# Patient Record
Sex: Female | Born: 1980 | Hispanic: No | Marital: Married | Smoking: Current every day smoker
Health system: Southern US, Community
[De-identification: ages and names within clinical notes are randomized; demographics above are authoritative.]

## PROBLEM LIST (undated history)

## (undated) DIAGNOSIS — K3184 Gastroparesis: Secondary | ICD-10-CM

## (undated) DIAGNOSIS — F32A Depression, unspecified: Secondary | ICD-10-CM

## (undated) DIAGNOSIS — G43909 Migraine, unspecified, not intractable, without status migrainosus: Secondary | ICD-10-CM

## (undated) DIAGNOSIS — F419 Anxiety disorder, unspecified: Secondary | ICD-10-CM

## (undated) DIAGNOSIS — M069 Rheumatoid arthritis, unspecified: Secondary | ICD-10-CM

## (undated) DIAGNOSIS — K219 Gastro-esophageal reflux disease without esophagitis: Secondary | ICD-10-CM

## (undated) DIAGNOSIS — F329 Major depressive disorder, single episode, unspecified: Secondary | ICD-10-CM

## (undated) HISTORY — PX: CHOLECYSTECTOMY: SHX55

## (undated) HISTORY — PX: OTHER SURGICAL HISTORY: SHX169

## (undated) HISTORY — PX: ABDOMINAL HYSTERECTOMY: SHX81

## (undated) HISTORY — PX: TONSILLECTOMY: SUR1361

---
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
## 2013-12-30 DEATH — deceased

## 2019-08-11 ENCOUNTER — Emergency Department (HOSPITAL_BASED_OUTPATIENT_CLINIC_OR_DEPARTMENT_OTHER): Payer: BC Managed Care – PPO

## 2019-08-11 ENCOUNTER — Emergency Department (HOSPITAL_BASED_OUTPATIENT_CLINIC_OR_DEPARTMENT_OTHER)
Admission: EM | Admit: 2019-08-11 | Discharge: 2019-08-11 | Disposition: A | Discharge status: Home / Self Care | Payer: BC Managed Care – PPO | Attending: Emergency Medicine | Admitting: Emergency Medicine

## 2019-08-11 ENCOUNTER — Other Ambulatory Visit: Payer: Self-pay

## 2019-08-11 ENCOUNTER — Encounter (HOSPITAL_BASED_OUTPATIENT_CLINIC_OR_DEPARTMENT_OTHER): Payer: Self-pay | Admitting: Emergency Medicine

## 2019-08-11 DIAGNOSIS — R509 Fever, unspecified: Secondary | ICD-10-CM | POA: Diagnosis not present

## 2019-08-11 DIAGNOSIS — M7918 Myalgia, other site: Secondary | ICD-10-CM | POA: Diagnosis present

## 2019-08-11 DIAGNOSIS — Z20822 Contact with and (suspected) exposure to covid-19: Secondary | ICD-10-CM

## 2019-08-11 DIAGNOSIS — F17200 Nicotine dependence, unspecified, uncomplicated: Secondary | ICD-10-CM | POA: Insufficient documentation

## 2019-08-11 DIAGNOSIS — Z79899 Other long term (current) drug therapy: Secondary | ICD-10-CM | POA: Diagnosis not present

## 2019-08-11 DIAGNOSIS — R197 Diarrhea, unspecified: Secondary | ICD-10-CM | POA: Diagnosis not present

## 2019-08-11 DIAGNOSIS — R1110 Vomiting, unspecified: Secondary | ICD-10-CM | POA: Insufficient documentation

## 2019-08-11 DIAGNOSIS — J45909 Unspecified asthma, uncomplicated: Secondary | ICD-10-CM | POA: Diagnosis not present

## 2019-08-11 HISTORY — DX: Gastro-esophageal reflux disease without esophagitis: K21.9

## 2019-08-11 HISTORY — DX: Migraine, unspecified, not intractable, without status migrainosus: G43.909

## 2019-08-11 HISTORY — DX: Rheumatoid arthritis, unspecified: M06.9

## 2019-08-11 HISTORY — DX: Anxiety disorder, unspecified: F41.9

## 2019-08-11 HISTORY — DX: Depression, unspecified: F32.A

## 2019-08-11 LAB — COMPREHENSIVE METABOLIC PANEL
ALT: 18 U/L (ref 0–44)
AST: 17 U/L (ref 15–41)
Albumin: 3.9 g/dL (ref 3.5–5.0)
Alkaline Phosphatase: 70 U/L (ref 38–126)
Anion gap: 7 (ref 5–15)
BUN: 10 mg/dL (ref 6–20)
CO2: 19 mmol/L — ABNORMAL LOW (ref 22–32)
Calcium: 8.9 mg/dL (ref 8.9–10.3)
Chloride: 111 mmol/L (ref 98–111)
Creatinine, Ser: 0.83 mg/dL (ref 0.44–1.00)
GFR calc Af Amer: >60 mL/min (ref >60)
GFR calc non Af Amer: >60 mL/min (ref >60)
Glucose, Bld: 91 mg/dL (ref 70–99)
Potassium: 3.7 mmol/L (ref 3.5–5.1)
Sodium: 137 mmol/L (ref 135–145)
Total Bilirubin: 0.8 mg/dL (ref 0.3–1.2)
Total Protein: 6.5 g/dL (ref 6.5–8.1)

## 2019-08-11 LAB — CBC WITH DIFFERENTIAL/PLATELET
Abs Immature Granulocytes: 0.05 10*3/uL (ref 0.00–0.07)
Basophils Absolute: 0 10*3/uL (ref 0.0–0.1)
Basophils Relative: 0 %
Eosinophils Absolute: 0.6 10*3/uL — ABNORMAL HIGH (ref 0.0–0.5)
Eosinophils Relative: 6 %
HCT: 46 % (ref 36.0–46.0)
Hemoglobin: 15.1 g/dL — ABNORMAL HIGH (ref 12.0–15.0)
Immature Granulocytes: 1 %
Lymphocytes Relative: 26 %
Lymphs Abs: 2.8 10*3/uL (ref 0.7–4.0)
MCH: 29.9 pg (ref 26.0–34.0)
MCHC: 32.8 g/dL (ref 30.0–36.0)
MCV: 91.1 fL (ref 80.0–100.0)
Monocytes Absolute: 1 10*3/uL (ref 0.1–1.0)
Monocytes Relative: 9 %
Neutro Abs: 6.1 10*3/uL (ref 1.7–7.7)
Neutrophils Relative %: 58 %
Platelets: 249 10*3/uL (ref 150–400)
RBC: 5.05 MIL/uL (ref 3.87–5.11)
RDW: 13.2 % (ref 11.5–15.5)
WBC: 10.5 10*3/uL (ref 4.0–10.5)
nRBC: 0 % (ref 0.0–0.2)

## 2019-08-11 LAB — SARS CORONAVIRUS 2 (TAT 6-24 HRS): SARS Coronavirus 2: NEGATIVE

## 2019-08-11 MED ORDER — SODIUM CHLORIDE 0.9 % IV BOLUS
1000.0000 mL | Freq: Once | INTRAVENOUS | Status: AC
  Administered 2019-08-11: 1000 mL via INTRAVENOUS

## 2019-08-11 MED ORDER — ONDANSETRON 4 MG PO TBDP: 4.0000 mg | ORAL_TABLET | Freq: Three times a day (TID) | ORAL | 0 refills | Status: AC | PRN

## 2019-08-11 NOTE — ED Notes (Signed)
Pt to restroom, delay to triage

## 2019-08-11 NOTE — ED Notes (Signed)
ED Provider at bedside. 

## 2019-08-11 NOTE — Discharge Instructions (Addendum)
We will contact you with the results of your Covid test when it is available. Continue Tylenol as needed increase your oral hydration. Return to the ED with start to develop chest pain, shortness of breath, inability to tolerate anything by mouth, lightheadedness or loss of consciousness.

## 2019-08-11 NOTE — ED Provider Notes (Signed)
[facility] Provider Note   CSN: [id number] Arrival date & time: 08/11/19  1015     History Chief Complaint  Patient presents with   Covid symptoms    [facility] is a 39 y.o. female with a past medical history of GERD, migraines, rheumatoid arthritis presenting to the ED with a chief complaint of body aches, vomiting and diarrhea.  Starting on the evening of 08/09/2019, patient had gradual onset of cramping abdominal pain, chills, nonbloody, nonbilious emesis and diarrhea.  States that her symptoms have gradually progressed since yesterday.  Fortunately she has not had any diarrhea or vomiting since the last night at 8 PM.  She reports generalized body aches, fatigue and nightly fevers, decreased appetite, persistent dry cough.  She has been taking Tylenol with some improvement in her symptoms.  She had a negative rapid Covid test yesterday and a negative rapid flu test today both at [facility].  She was sent to the ER for further evaluation concern for Covid.  She denies any known COVID-19 exposures, shortness of breath, chest pain, urinary symptoms, possibility of pregnancy (has a hysterectomy), chronic lung disease, supplemental oxygen use at baseline, neck stiffness, injuries or falls.  HPI     Past Medical History:  Diagnosis Date   Anxiety    Depression    GERD (gastroesophageal reflux disease)    Migraines    Rheumatoid arthritis ([place])     There are no problems to display for this patient.    OB History   No obstetric history on file.     No family history on file.  Social History   Tobacco Use   Smoking status: Current Every Day Smoker  Substance Use Topics   Alcohol use: Never   Drug use: Not on file    Home Medications Prior to Admission medications   Medication Sig Start Date End Date Taking? Authorizing Provider  ARIPiprazole (ABILIFY) 10 MG tablet  07/28/19  Yes [provider]  gabapentin  (NEURONTIN) 300 MG capsule  07/15/19  Yes [provider]  montelukast (SINGULAIR) 10 MG tablet  09/29/18  Yes [provider]  pantoprazole (PROTONIX) 40 MG tablet take 1 tablet by mouth once daily 05/23/16  Yes [provider]  methocarbamol (ROBAXIN) 500 MG tablet Take 500 mg by mouth 3 (three) times daily. 07/15/19   [provider]  ondansetron (ZOFRAN ODT) 4 MG disintegrating tablet Take 1 tablet (4 mg total) by mouth every 8 (eight) hours as needed for nausea or vomiting. 08/11/19   Anupama Piehl, PA-C    Allergies    Azithromycin, Doxycycline, Tizanidine, Nsaids, and Valacyclovir  Review of Systems   Review of Systems  Constitutional: Positive for appetite change, chills, fatigue and fever.  HENT: Negative for ear pain, rhinorrhea, sneezing and sore throat.   Eyes: Negative for photophobia and visual disturbance.  Respiratory: Positive for cough. Negative for chest tightness, shortness of breath and wheezing.   Cardiovascular: Negative for chest pain and palpitations.  Gastrointestinal: Positive for diarrhea, nausea and vomiting. Negative for abdominal pain, blood in stool and constipation.  Genitourinary: Negative for dysuria, hematuria and urgency.  Musculoskeletal: Positive for myalgias.  Skin: Negative for rash.  Neurological: Negative for dizziness, weakness and light-headedness.    Physical Exam Updated Vital Signs BP (!) 138/92 (BP Location: Right Arm)    Pulse 90    Temp 97.8 F (36.6 C) (Oral)    Resp 16    Ht 5\' 3"  (  1.6 m)    Wt 113.4 kg    SpO2 100%    BMI 44.29 kg/m   Physical Exam Vitals and nursing note reviewed.  Constitutional:      General: She is not in acute distress.    Appearance: She is well-developed.  HENT:     Head: Normocephalic and atraumatic.     Nose: Nose normal.  Eyes:     General: No scleral icterus.       Left eye: No discharge.     Conjunctiva/sclera: Conjunctivae normal.  Cardiovascular:     Rate and  Rhythm: Normal rate and regular rhythm.     Heart sounds: Normal heart sounds. No murmur. No friction rub. No gallop.   Pulmonary:     Effort: Pulmonary effort is normal. No respiratory distress.     Breath sounds: Normal breath sounds.  Abdominal:     General: Bowel sounds are normal. There is no distension.     Palpations: Abdomen is soft.     Tenderness: There is no abdominal tenderness. There is no guarding.  Musculoskeletal:        General: Normal range of motion.     Cervical back: Normal range of motion and neck supple.  Skin:    General: Skin is warm and dry.     Findings: No rash.  Neurological:     Mental Status: She is alert.     Motor: No abnormal muscle tone.     Coordination: Coordination normal.     ED Results / Procedures / Treatments   Labs (all labs ordered are listed, but only abnormal results are displayed) Labs Reviewed  CBC WITH DIFFERENTIAL/PLATELET - Abnormal; Notable for the following components:      Result Value   Hemoglobin 15.1 (*)    Eosinophils Absolute 0.6 (*)    All other components within normal limits  COMPREHENSIVE METABOLIC PANEL - Abnormal; Notable for the following components:   CO2 19 (*)    All other components within normal limits  SARS CORONAVIRUS 2 (TAT 6-24 HRS)    EKG None  Radiology DG Chest Portable 1 View  Result Date: 08/11/2019 CLINICAL DATA:  Fever.  Generalized body ache EXAM: PORTABLE CHEST 1 VIEW COMPARISON:  January 05, 2019 FINDINGS: Lungs are clear. Heart size and pulmonary vascularity are normal. No adenopathy. No bone lesions. IMPRESSION: No abnormality noted. Electronically Signed   By: Bretta Bang III M.D.   On: 08/11/2019 11:03    Procedures Procedures (including critical care time)  Medications Ordered in ED Medications  sodium chloride 0.9 % bolus 1,000 mL (0 mLs Intravenous Stopped 08/11/19 1248)    ED Course  I have reviewed the triage vital signs and the nursing notes.  Pertinent labs & imaging  results that were available during my care of the patient were reviewed by me and considered in my medical decision making (see chart for details).    MDM Rules/Calculators/A&P                      [facility] was evaluated in Emergency Department on 08/11/19 for the symptoms described in the history of present illness. He/she was evaluated in the context of the global COVID-19 pandemic, which necessitated consideration that the patient might be at risk for infection with the SARS-CoV-2 virus that causes COVID-19. Institutional protocols and algorithms that pertain to the evaluation of patients at risk for COVID-19 are in a state of rapid change based on information  released by regulatory bodies including the CDC and federal and state organizations. These policies and algorithms were followed during the patient's care in the ED.  39 year old female presenting to the ED with 2-day history of body aches, vomiting and diarrhea.  Symptoms have improved slightly since yesterday at 8 PM.  Reports generalized body aches, nightly fevers, fatigue and persistent dry cough as well.  She was sent to the ER for further management from [facility].  On my exam abdomen is soft, nontender nondistended.  Lungs are clear to auscultation bilaterally.  Her vital signs are within normal limits, she is not tachycardic, tachypneic or hypoxic.  She has had a negative rapid Covid test and rapid flu test in the past 2 days.  Work-up here including CBC, CMP unremarkable.  Chest x-ray without any acute findings.  Patient was given IV fluids with improvement in her symptoms.  I have ordered another conclusive Covid test which is pending.  I feel that her symptoms could be related to Covid or other viral infection.  Fortunately she is in no respiratory distress and has no indication for admission at this time.  She is not requiring supplemental oxygen.  Discussed home measures to help with Covid or viral symptoms in symptoms that  would warrant her return to the ER.  Will give Zofran as needed.  Patient is hemodynamically stable, in NAD, and able to ambulate in the ED. Evaluation does not show pathology that would require ongoing emergent intervention or inpatient treatment. I explained the diagnosis to the patient. Pain has been managed and has no complaints prior to discharge. Patient is comfortable with above plan and is stable for discharge at this time. All questions were answered prior to disposition. Strict return precautions for returning to the ED were discussed. Encouraged follow up with PCP.   An After Visit Summary was printed and given to the patient.   Portions of this note were generated with Scientist, clinical (histocompatibility and immunogenetics). Dictation errors may occur despite best attempts at proofreading.  Final Clinical Impression(s) / ED Diagnoses Final diagnoses:  Suspected COVID-19 virus infection    Rx / DC Orders ED Discharge Orders         Ordered    ondansetron (ZOFRAN ODT) 4 MG disintegrating tablet  Every 8 hours PRN     08/11/19 1300           Dietrich Pates, PA-C 08/11/19 1301    Milagros Loll, MD 08/13/19 [phone]

## 2019-08-11 NOTE — ED Notes (Signed)
Portable Xray at bedside.

## 2019-08-11 NOTE — ED Triage Notes (Addendum)
Pt reports abdominal pain with generalized body aches, fever, HA, N/V/D with loss of appetite x 2 days. Pt endorses 1500 mg tylenol 4 hrs pta

## 2020-02-13 ENCOUNTER — Emergency Department (HOSPITAL_BASED_OUTPATIENT_CLINIC_OR_DEPARTMENT_OTHER)
Admission: EM | Admit: 2020-02-13 | Discharge: 2020-02-13 | Disposition: A | Discharge status: Home / Self Care | Payer: Managed Care, Other (non HMO) | Attending: Emergency Medicine | Admitting: Emergency Medicine

## 2020-02-13 ENCOUNTER — Other Ambulatory Visit: Payer: Self-pay

## 2020-02-13 ENCOUNTER — Emergency Department (HOSPITAL_BASED_OUTPATIENT_CLINIC_OR_DEPARTMENT_OTHER): Payer: Managed Care, Other (non HMO)

## 2020-02-13 ENCOUNTER — Encounter (HOSPITAL_BASED_OUTPATIENT_CLINIC_OR_DEPARTMENT_OTHER): Payer: Self-pay | Admitting: Emergency Medicine

## 2020-02-13 DIAGNOSIS — M546 Pain in thoracic spine: Secondary | ICD-10-CM

## 2020-02-13 DIAGNOSIS — F17200 Nicotine dependence, unspecified, uncomplicated: Secondary | ICD-10-CM | POA: Insufficient documentation

## 2020-02-13 DIAGNOSIS — M549 Dorsalgia, unspecified: Secondary | ICD-10-CM

## 2020-02-13 HISTORY — DX: Gastroparesis: K31.84

## 2020-02-13 MED ORDER — PREDNISONE 20 MG PO TABS: ORAL_TABLET | ORAL | 0 refills | Status: AC

## 2020-02-13 NOTE — ED Provider Notes (Signed)
[facility] EMERGENCY DEPARTMENT Provider Note   CSN: [id number] Arrival date & time: 02/13/20  0222     History Chief Complaint  Patient presents with  . Back Pain    [facility] is a 39 y.o. female.  HPI Patient reports that for months she is perceived a feeling of movement in her lower thoracic back when she twists or bends a certain way.  But it always seems to "pop back in".  She reports that yesterday she was twisting and lifting some cans and felt a movement in her back and then pain.  She reports that that is persisted now.  She has a aching sharp pain in her lower mid thoracic back.  She reports at one point it seemed to radiate down to her left thigh but now that has resolved.  She now has some degree of radiation around the left back but mostly pain is staying central.  It is worse by twisting and bending.  No weakness numbness or tingling to the legs or arms.  No abdominal pain.  No difficulty urinating or with bowel movement.  She has recently had some diarrhea.  No shortness of breath or chest pain.  Patient tried Tylenol yesterday evening without relief.  She also takes Robaxin regularly without relief of this pain.    Past Medical History:  Diagnosis Date  . Anxiety   . Depression   . Gastroparesis   . GERD (gastroesophageal reflux disease)   . Migraines   . Rheumatoid arthritis ([facility])     There are no problems to display for this patient.   Past Surgical History:  Procedure Laterality Date  . ABDOMINAL HYSTERECTOMY    . bladder sling    . CHOLECYSTECTOMY    . TONSILLECTOMY       OB History   No obstetric history on file.     No family history on file.  Social History   Tobacco Use  . Smoking status: Current Every Day Smoker  . Smokeless tobacco: Never Used  Substance Use Topics  . Alcohol use: Never  . Drug use: Not on file    Home Medications Prior to Admission medications   Medication Sig Start Date End Date Taking? Authorizing  Provider  ARIPiprazole (ABILIFY) 10 MG tablet  07/28/19  Yes [provider]  celecoxib (CELEBREX) 100 MG capsule Take by mouth. 08/18/19  Yes [provider]  escitalopram (LEXAPRO) 20 MG tablet Take 20 mg by mouth daily.   Yes [provider]  etanercept (ENBREL SURECLICK) 50 MG/ML injection Inject into the skin. 03/02/19  Yes [provider]  gabapentin (NEURONTIN) 300 MG capsule  07/15/19  Yes [provider]  methocarbamol (ROBAXIN) 500 MG tablet Take 500 mg by mouth 3 (three) times daily. 07/15/19  Yes [provider]  montelukast (SINGULAIR) 10 MG tablet  09/29/18  Yes [provider]  pantoprazole (PROTONIX) 40 MG tablet take 1 tablet by mouth once daily 05/23/16  Yes [provider]  zolpidem (AMBIEN) 5 MG tablet Take 5 mg by mouth at bedtime as needed for sleep.   Yes [provider]  ondansetron (ZOFRAN ODT) 4 MG disintegrating tablet Take 1 tablet (4 mg total) by mouth every 8 (eight) hours as needed for nausea or vomiting. 08/11/19   Khatri, Hina, PA-C  predniSONE (DELTASONE) 20 MG tablet 3 tabs po day one, then 2 po daily x 4 days 02/13/20   Arby Barrette, MD    Allergies  Azithromycin, Doxycycline, Tizanidine, Nsaids, and Valacyclovir  Review of Systems   Review of Systems 10 systems reviewed and negative except as per HPI Physical Exam Updated Vital Signs BP (!) 149/87 (BP Location: Right Arm)   Pulse 79   Temp 98.1 F (36.7 C) (Oral)   Resp 20   Ht 5\' 2"  (1.575 m)   Wt 117.5 kg   SpO2 97%   BMI 47.37 kg/m   Physical Exam Constitutional:      Appearance: She is well-developed.     Comments: Well-appearing.  Nontoxic.  No acute distress.  HENT:     Head: Normocephalic and atraumatic.  Eyes:     Extraocular Movements: Extraocular movements intact.  Cardiovascular:     Rate and Rhythm: Normal rate and regular rhythm.     Heart sounds: Normal heart sounds.  Pulmonary:     Effort:  Pulmonary effort is normal.     Breath sounds: Normal breath sounds.  Abdominal:     General: Bowel sounds are normal. There is no distension.     Palpations: Abdomen is soft.     Tenderness: There is no abdominal tenderness.  Musculoskeletal:        General: No swelling or tenderness. Normal range of motion.     Cervical back: Neck supple.     Comments: Patient endorses moderate pain to palpation in the mid thoracic spine from about T10-T12.  No step-off or deformity.  Pain is not severe.  Pain reproduced mildly by twisting at the thoracic spine.  Skin:    General: Skin is warm and dry.  Neurological:     General: No focal deficit present.     Mental Status: She is alert and oriented to person, place, and time.     GCS: GCS eye subscore is 4. GCS verbal subscore is 5. GCS motor subscore is 6.     Coordination: Coordination normal.  Psychiatric:        Mood and Affect: Mood normal.     ED Results / Procedures / Treatments   Labs (all labs ordered are listed, but only abnormal results are displayed) Labs Reviewed - No data to display  EKG None  Radiology DG Thoracic Spine W/Swimmers  Result Date: 02/13/2020 CLINICAL DATA:  Midline back pain EXAM: THORACIC SPINE - 3 VIEWS COMPARISON:  None. FINDINGS: Thoracic vertebral body heights and alignment are maintained. No evidence of fracture. Intervertebral disc heights are preserved. IMPRESSION: Negative. Electronically Signed   By: 02/15/2020 M.D.   On: 02/13/2020 08:12   DG Lumbar Spine Complete  Result Date: 02/13/2020 CLINICAL DATA:  Back pain EXAM: LUMBAR SPINE - COMPLETE 4+ VIEW COMPARISON:  2020 FINDINGS: Transitional anatomy at the lumbosacral junction. Vertebral body heights and alignment are maintained. Intervertebral disc heights are preserved. No significant change since 2020. Cholecystectomy clips. IMPRESSION: Transitional anatomy at the lumbosacral junction. Otherwise unremarkable study. Electronically Signed   By:  2021 M.D.   On: 02/13/2020 08:14    Procedures Procedures (including critical care time)  Medications Ordered in ED Medications - No data to display  ED Course  I have reviewed the triage vital signs and the nursing notes.  Pertinent labs & imaging results that were available during my care of the patient were reviewed by me and considered in my medical decision making (see chart for details).    MDM Rules/Calculators/A&P  Patient has been experiencing some perception of joint laxity in the thoracic back.  She acutely got pain with movement yesterday.  At first, possible radicular symptoms but those are resolved.  Patient is neurologically intact.  No abdominal pain or chest pain.  Pain is reproducible by some palpation over the bony elements of the lower thoracic spine and by twisting.  At this time plan will be for a short prednisone burst.  Patient will continue her Robaxin and take acetaminophen every 6 hours for pain.  Patient is recommended to follow-up with her PCP for reevaluation.  At this time no MRI indicated.  We have need for follow-up and possible MRI if recurrence of radicular symptoms, numbness or weakness. Final Clinical Impression(s) / ED Diagnoses Final diagnoses:  Acute midline thoracic back pain    Rx / DC Orders ED Discharge Orders         Ordered    predniSONE (DELTASONE) 20 MG tablet     Discontinue  Reprint     02/13/20 6629           Arby Barrette, MD 02/13/20 [phone]

## 2020-02-13 NOTE — ED Triage Notes (Signed)
Reports feeling like something was slipping in and out of her mid back for about a month. Today she had the same feeling but it has been painful.  Reports she thinks its a slipped disk.  Taking tylenol for pain.  Last dose around 6pm.

## 2020-02-13 NOTE — ED Notes (Signed)
ED Provider at bedside. 

## 2020-12-12 ENCOUNTER — Emergency Department (HOSPITAL_COMMUNITY): Payer: 59

## 2020-12-12 ENCOUNTER — Emergency Department (HOSPITAL_COMMUNITY)
Admission: EM | Admit: 2020-12-12 | Discharge: 2020-12-12 | Disposition: A | Discharge status: Home / Self Care | Payer: 59 | Attending: Emergency Medicine | Admitting: Emergency Medicine

## 2020-12-12 DIAGNOSIS — R0602 Shortness of breath: Secondary | ICD-10-CM | POA: Diagnosis present

## 2020-12-12 DIAGNOSIS — Z5321 Procedure and treatment not carried out due to patient leaving prior to being seen by health care provider: Secondary | ICD-10-CM | POA: Insufficient documentation

## 2020-12-12 DIAGNOSIS — R079 Chest pain, unspecified: Secondary | ICD-10-CM | POA: Diagnosis not present

## 2020-12-12 LAB — CBC
HCT: 42.9 % (ref 36.0–46.0)
Hemoglobin: 14.2 g/dL (ref 12.0–15.0)
MCH: 29.9 pg (ref 26.0–34.0)
MCHC: 33.1 g/dL (ref 30.0–36.0)
MCV: 90.3 fL (ref 80.0–100.0)
Platelets: 281 10*3/uL (ref 150–400)
RBC: 4.75 MIL/uL (ref 3.87–5.11)
RDW: 13.5 % (ref 11.5–15.5)
WBC: 10.9 10*3/uL — ABNORMAL HIGH (ref 4.0–10.5)
nRBC: 0 % (ref 0.0–0.2)

## 2020-12-12 LAB — BASIC METABOLIC PANEL
Anion gap: 7 (ref 5–15)
BUN: 8 mg/dL (ref 6–20)
CO2: 20 mmol/L — ABNORMAL LOW (ref 22–32)
Calcium: 8.8 mg/dL — ABNORMAL LOW (ref 8.9–10.3)
Chloride: 109 mmol/L (ref 98–111)
Creatinine, Ser: 1.03 mg/dL — ABNORMAL HIGH (ref 0.44–1.00)
GFR, Estimated: >60 mL/min (ref >60)
Glucose, Bld: 78 mg/dL (ref 70–99)
Potassium: 4.2 mmol/L (ref 3.5–5.1)
Sodium: 136 mmol/L (ref 135–145)

## 2020-12-12 LAB — TROPONIN I (HIGH SENSITIVITY): Troponin I (High Sensitivity): 3 ng/L (ref <18)

## 2020-12-12 NOTE — ED Notes (Signed)
LWBS 

## 2020-12-12 NOTE — ED Triage Notes (Signed)
Pt reports sharp chest pain in central/left chest onset 0530 this morning while lying in bed. Pain worse with exertion, endorses shortness of breath.

## 2021-06-08 ENCOUNTER — Other Ambulatory Visit: Payer: Self-pay | Admitting: Rheumatology

## 2021-06-08 DIAGNOSIS — M0609 Rheumatoid arthritis without rheumatoid factor, multiple sites: Secondary | ICD-10-CM

## 2021-06-20 ENCOUNTER — Encounter (HOSPITAL_COMMUNITY)
Admission: RE | Admit: 2021-06-20 | Discharge: 2021-06-20 | Disposition: A | Discharge status: Home / Self Care | Payer: 59 | Source: Ambulatory Visit | Attending: Rheumatology | Admitting: Rheumatology

## 2021-06-20 ENCOUNTER — Other Ambulatory Visit: Payer: Self-pay

## 2021-06-20 DIAGNOSIS — M0609 Rheumatoid arthritis without rheumatoid factor, multiple sites: Secondary | ICD-10-CM | POA: Insufficient documentation

## 2021-06-20 MED ORDER — TECHNETIUM TC 99M MEDRONATE IV KIT
20.0000 | PACK | Freq: Once | INTRAVENOUS | Status: AC | PRN
  Administered 2021-06-20: 11:00:00 22 via INTRAVENOUS

## 2021-07-07 IMAGING — CR DG LUMBAR SPINE COMPLETE 4+V
5 series · 5 of 5 positions shown · non-contrast
Comparison: 3636

CLINICAL DATA: Back pain

EXAM:
LUMBAR SPINE - COMPLETE 4+ VIEW

[t l-spine a.p.]
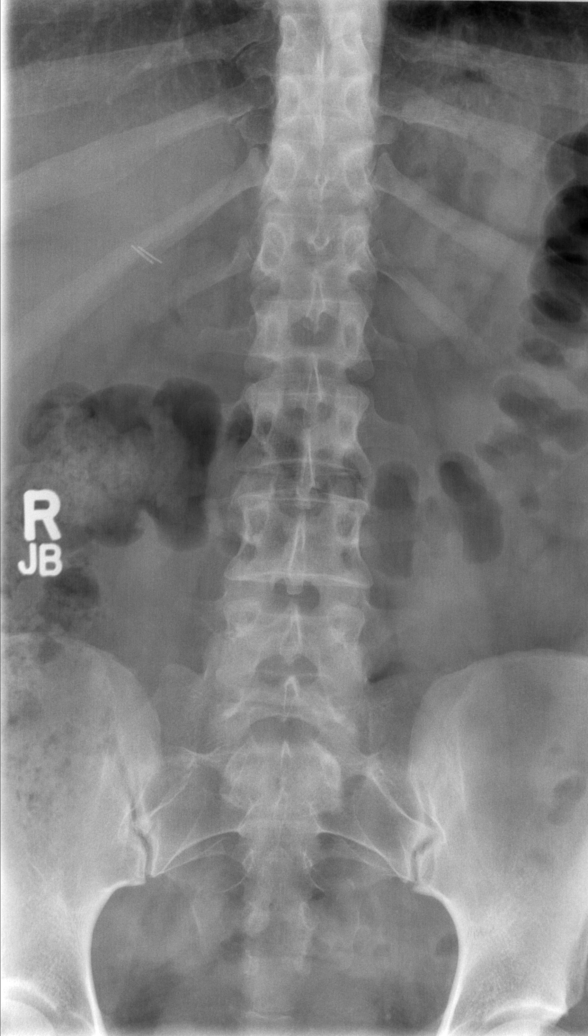

[t l-spine oblique exposure (1 of 2)]
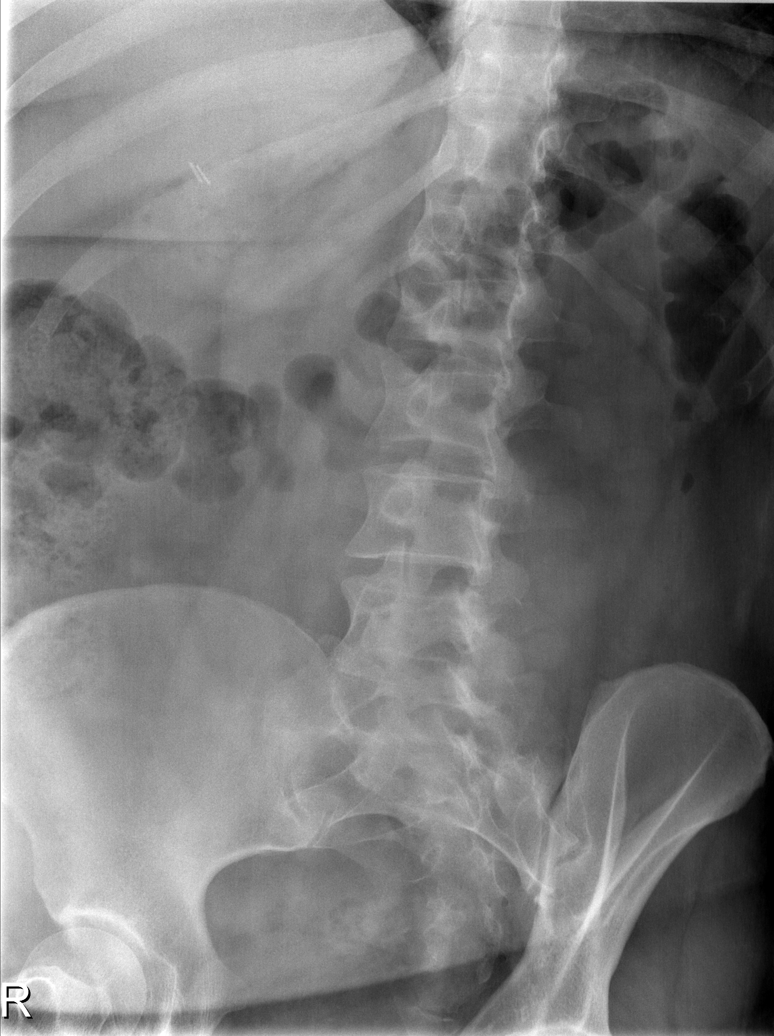

[t l-spine oblique exposure (2 of 2)]
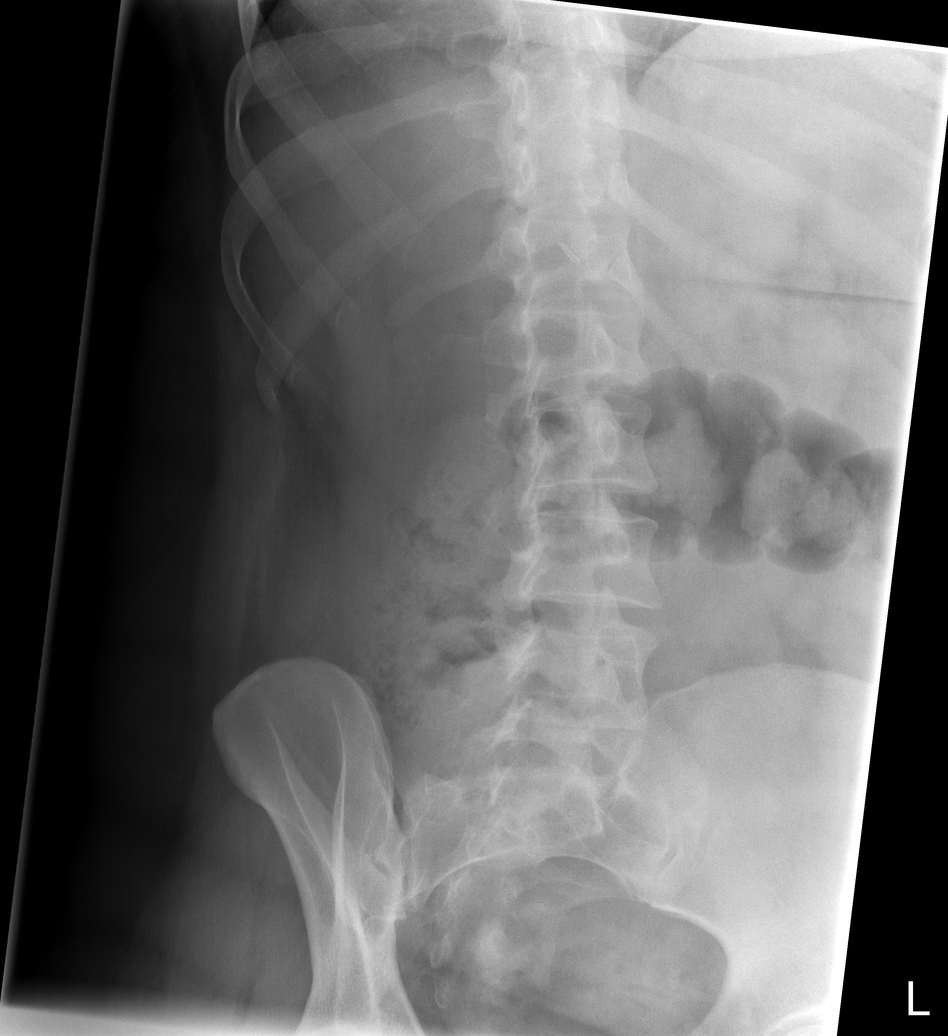

[t l-spine lat]
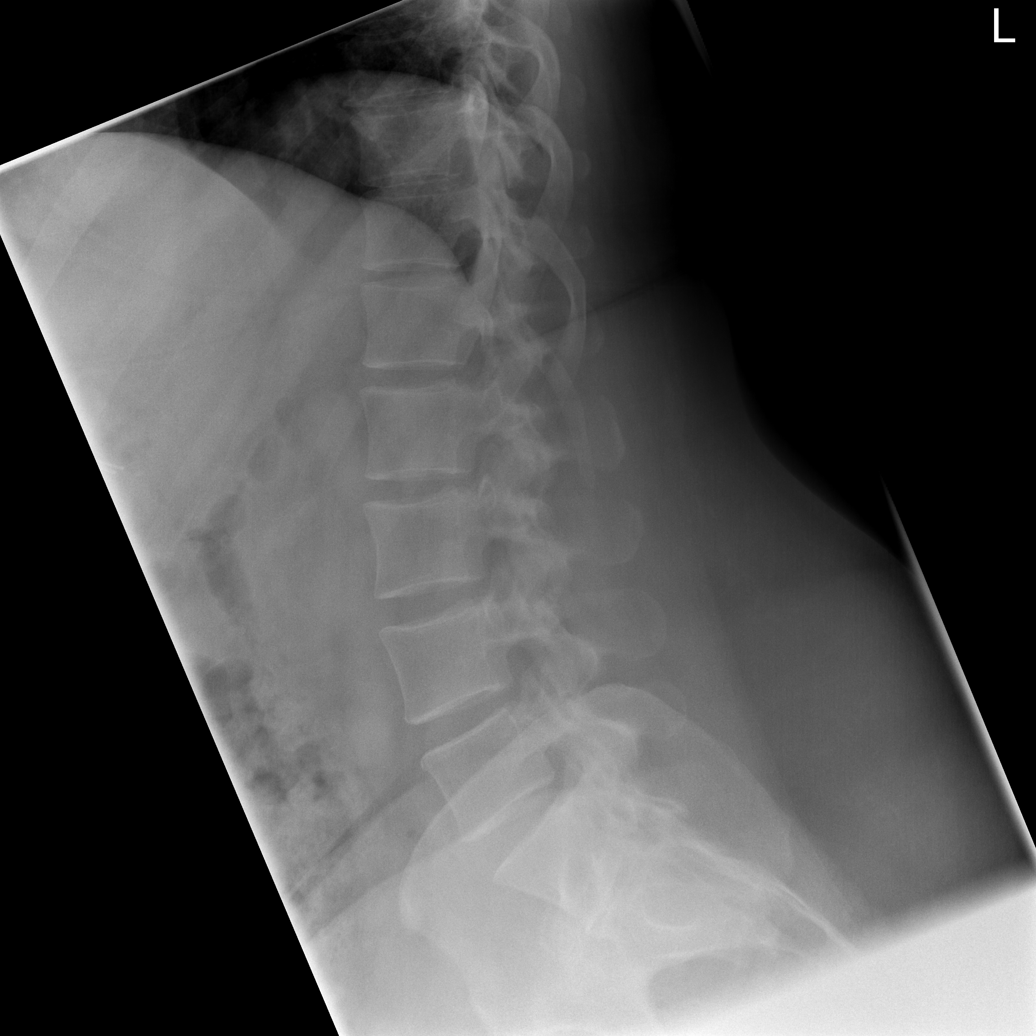

[t l-spine l5-s1 spot]
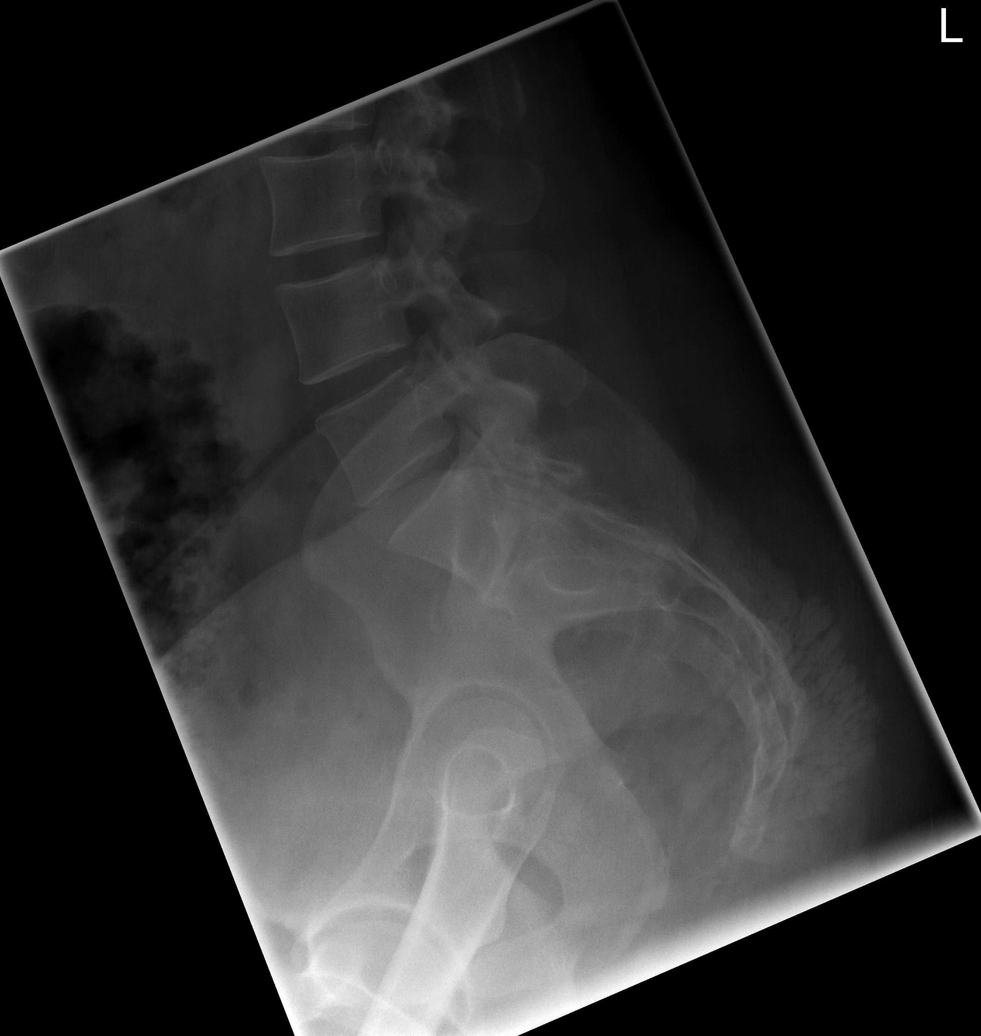

[5 of 5 positions shown; findings below may reference images not displayed]

FINDINGS: Transitional anatomy at the lumbosacral junction. Vertebral body
heights and alignment are maintained. Intervertebral disc heights
are preserved. No significant change since 3636. Cholecystectomy
clips.
IMPRESSION: Transitional anatomy at the lumbosacral junction. Otherwise
unremarkable study.

## 2021-07-07 IMAGING — CR DG THORACIC SPINE 3V
3 series · 3 of 3 positions shown · non-contrast
Comparison: None.

CLINICAL DATA: Midline back pain

EXAM:
THORACIC SPINE - 3 VIEWS

[t t-spine a.p.]
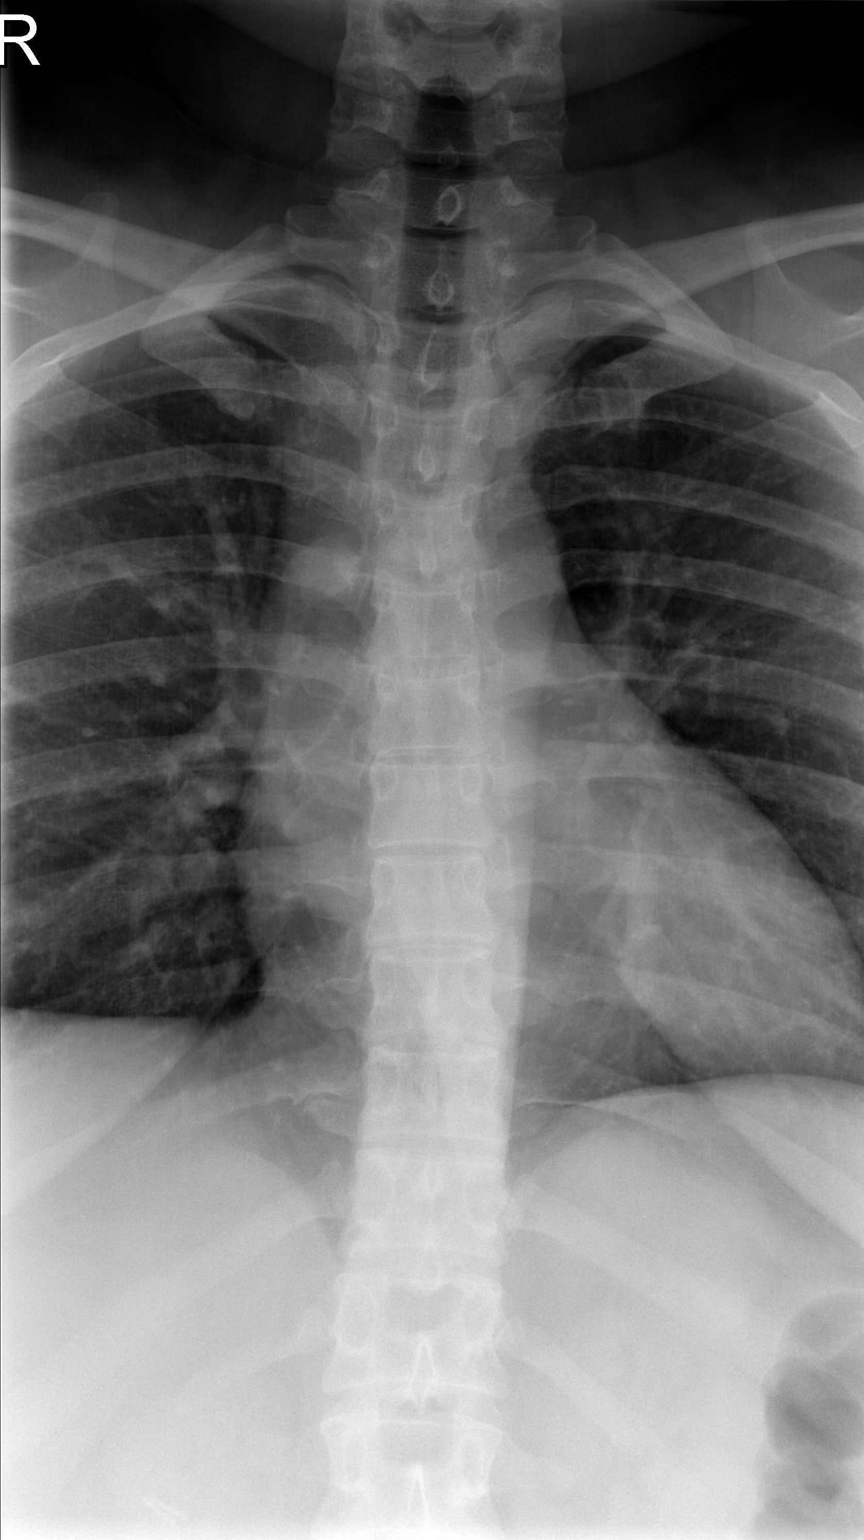

[t t-spine lat]
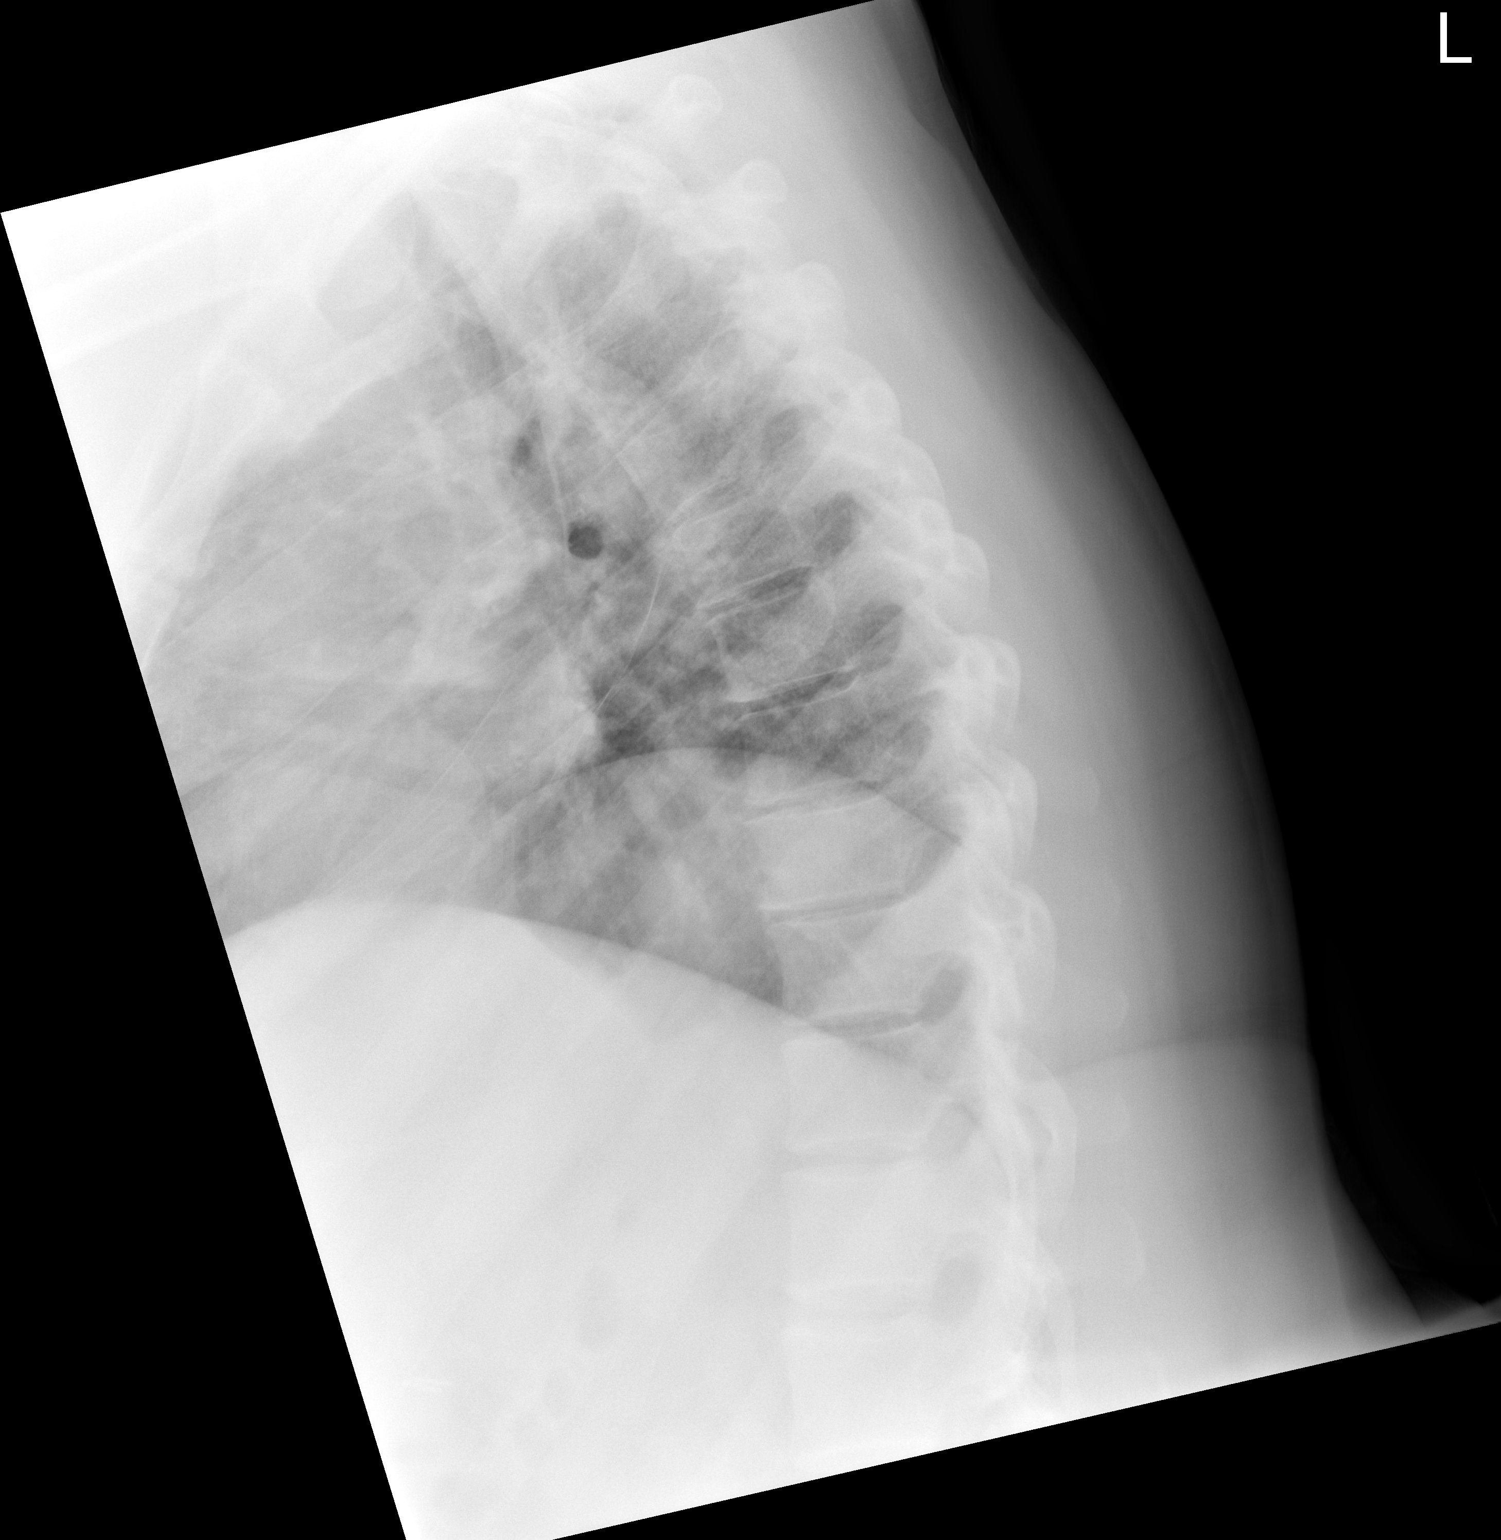

[t swimmers]
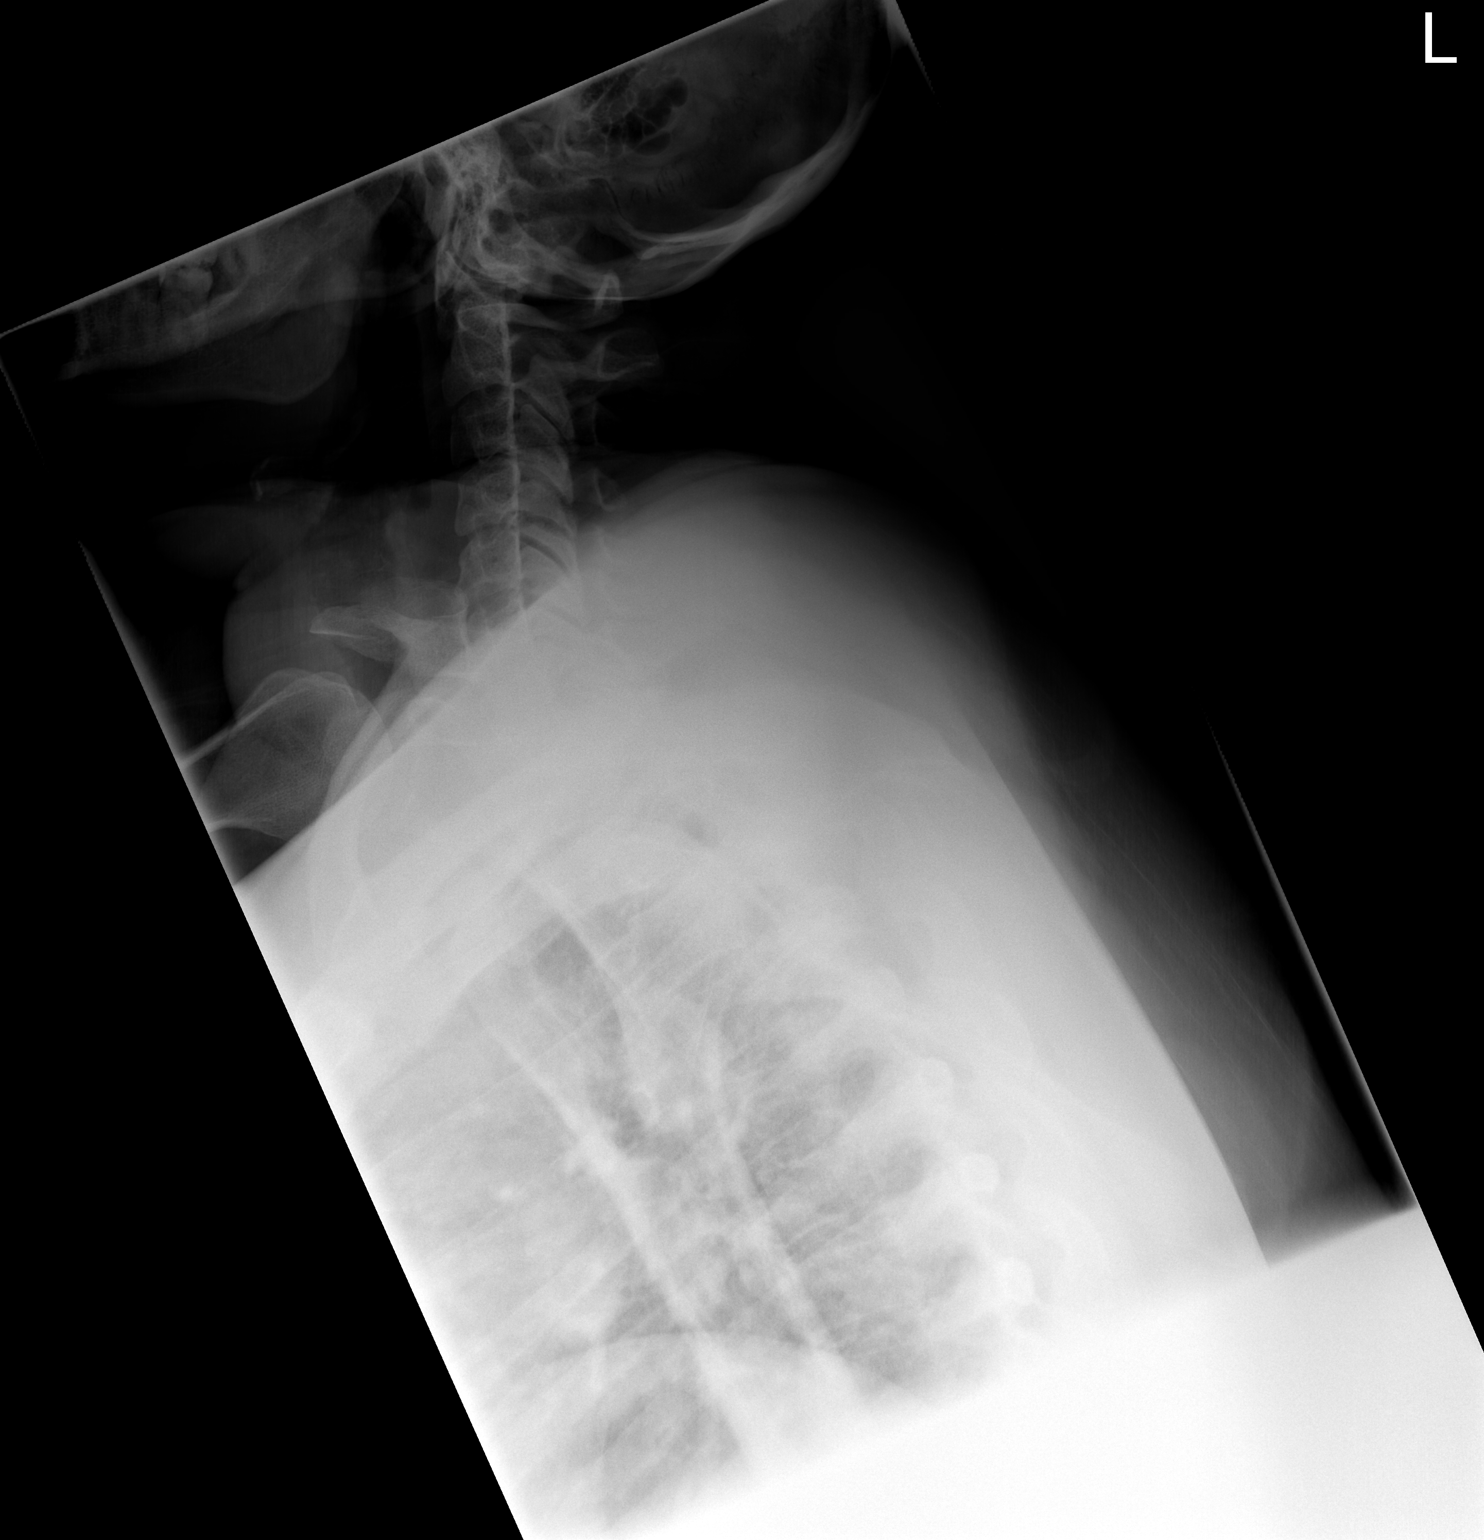

[3 of 3 positions shown; findings below may reference images not displayed]

FINDINGS: Thoracic vertebral body heights and alignment are maintained. No
evidence of fracture. Intervertebral disc heights are preserved.
IMPRESSION: Negative.
